# Patient Record
Sex: Male | Born: 1965 | Race: White | Hispanic: No | Marital: Married | Smoking: Never smoker
Health system: Southern US, Community
[De-identification: ages and names within clinical notes are randomized; demographics above are authoritative.]

## PROBLEM LIST (undated history)

## (undated) DIAGNOSIS — M722 Plantar fascial fibromatosis: Secondary | ICD-10-CM

## (undated) DIAGNOSIS — I493 Ventricular premature depolarization: Secondary | ICD-10-CM

## (undated) HISTORY — PX: INGUINAL HERNIA REPAIR: SUR1180

---

## 2002-08-10 ENCOUNTER — Encounter (INDEPENDENT_AMBULATORY_CARE_PROVIDER_SITE_OTHER): Payer: Self-pay

## 2002-08-10 ENCOUNTER — Ambulatory Visit (HOSPITAL_BASED_OUTPATIENT_CLINIC_OR_DEPARTMENT_OTHER): Admission: RE | Admit: 2002-08-10 | Discharge: 2002-08-10 | Payer: Self-pay | Admitting: General Surgery

## 2003-12-11 ENCOUNTER — Encounter: Admission: RE | Admit: 2003-12-11 | Discharge: 2003-12-11 | Payer: Self-pay | Admitting: Family Medicine

## 2004-01-02 ENCOUNTER — Encounter: Admission: RE | Admit: 2004-01-02 | Discharge: 2004-01-02 | Payer: Self-pay | Admitting: Gastroenterology

## 2009-01-17 ENCOUNTER — Emergency Department (HOSPITAL_COMMUNITY): Admission: EM | Admit: 2009-01-17 | Discharge: 2009-01-17 | Payer: Self-pay | Admitting: Family Medicine

## 2010-09-20 NOTE — Op Note (Signed)
NAME:  Lance Christensen, Lance Christensen                        ACCOUNT NO.:  [id number]   MEDICAL RECORD NO.:  [id number]                   PATIENT TYPE:  AMB   LOCATION:  DSC                                  FACILITY:  [facility]   PHYSICIAN:  Ollen Gross. Vernell Morgans, M.D.              DATE OF BIRTH:  1965-12-06   DATE OF PROCEDURE:  08/26/2002  DATE OF DISCHARGE:  08/10/2002                                 OPERATIVE REPORT   PREOPERATIVE DIAGNOSIS:  Left inguinal hernia.   POSTOPERATIVE DIAGNOSIS:  Left indirect inguinal hernia.   PROCEDURE:  Left inguinal hernia repair  with mesh.   SURGEON:  Ollen Gross. Carolynne Edouard, M.D.   ANESTHESIA:  General.   DESCRIPTION OF PROCEDURE:  After informed consent was obtained the patient  was brought to the operating room and placed in the supine position on the  operating table. After adequate induction of general anesthesia the  patient's left groin and abdomen were prepped with Betadine and draped in  the usual sterile manner.   An incision was made from the edge of the pubic tubercle towards the  anterior iliac spine on the left side for a distance of about 5 cm. This  incision was carried down through the skin and subcutaneous tissue sharply  with the electrocautery. A small bridging vein in the subcutaneous tissue  was encountered and clamped, divided and ligated with 3-0 silk ties.   This dissection was then carried down until the fibers of external oblique  were encountered. These fibers were opened along the fibers through the apex  of the external ring using #15 blade knife and Metzenbaum scissors. The  ilioinguinal nerve was identified with the cord and care was taken to avoid  this nerve.   Blunt dissection was carried out at the edge of the pubic tubercle until the  cord structures could be surrounded with 2 fingers. A 1/4-inch Penrose drain  was then placed around the cord structures for retraction purposes. Blunt  skeletonization of the cord was then carried  out and a hernia sac was able  to be separated from the rest of the cord structures without injury to the  vessels of the vas deferens.   The hernia sac was then opened. There were no viscera within the sac. The  sac was then ligated in its space with a 2-0 silk suture ligature and excess  sac was excised and sent to pathology. The stump of the sac was allowed to  retract behind the transversalis muscle.   The floor of the inguinal canal was then reinforced using a 3 x 6 piece  Atrium mesh. Tails were cut in the mesh and wrapped around the cord  structures. The mesh was then sewed inferiorly to the shelving edge of the  inguinal ligament with a running 2-0 Prolene stitch.   The mesh was then tacked superiorly to the muscular aponeurotic strength  layer of  the transversalis with vertical mattress sutures of 2-0 Prolene.  When this was finished the tails were wrapped around the cord structures and  anchored laterally to the shelving edge of the inguinal ligament. The mesh  was in good position without any tension.   The wound was irrigated with saline. The external oblique fascia was then  reapproximated using a running 3-0 Vicryl stitch. The subcutaneous fascia  was then reapproximated with interrupted 3-0 Vicryl stitches. The wound was  then infiltrated with 0.25% Marcaine and the skin was closed with a running  4-0 Monocryl subcuticular stitch. Benzoin, Steri-Strips and sterile  dressings were applied.   The patient tolerated the procedure well. All sponge, instrument and needle  counts were correct at the end of the case. The patient was awakened and  taken to the recovery room in stable condition.                                               Ollen Gross. Vernell Morgans, M.D.    PST/MEDQ  D:  08/26/2002  T:  08/29/2002  Job:  [id number]

## 2014-06-05 ENCOUNTER — Emergency Department (HOSPITAL_COMMUNITY): Payer: 59

## 2014-06-05 ENCOUNTER — Emergency Department (HOSPITAL_COMMUNITY)
Admission: EM | Admit: 2014-06-05 | Discharge: 2014-06-05 | Disposition: A | Discharge status: Home / Self Care | Payer: 59 | Attending: Emergency Medicine | Admitting: Emergency Medicine

## 2014-06-05 DIAGNOSIS — R002 Palpitations: Secondary | ICD-10-CM | POA: Insufficient documentation

## 2014-06-05 DIAGNOSIS — Z880 Allergy status to penicillin: Secondary | ICD-10-CM | POA: Diagnosis not present

## 2014-06-05 LAB — BASIC METABOLIC PANEL
ANION GAP: 8 (ref 5–15)
BUN: 14 mg/dL (ref 6–23)
CALCIUM: 9.4 mg/dL (ref 8.4–10.5)
CHLORIDE: 103 mmol/L (ref 96–112)
CO2: 28 mmol/L (ref 19–32)
Creatinine, Ser: 1.39 mg/dL — ABNORMAL HIGH (ref 0.50–1.35)
GFR, EST AFRICAN AMERICAN: 67 mL/min — AB (ref >90)
GFR, EST NON AFRICAN AMERICAN: 58 mL/min — AB (ref >90)
GLUCOSE: 91 mg/dL (ref 70–99)
POTASSIUM: 3.3 mmol/L — AB (ref 3.5–5.1)
SODIUM: 139 mmol/L (ref 135–145)

## 2014-06-05 LAB — CBC
HCT: 41.3 % (ref 39.0–52.0)
HEMOGLOBIN: 14.6 g/dL (ref 13.0–17.0)
MCH: 30.9 pg (ref 26.0–34.0)
MCHC: 35.4 g/dL (ref 30.0–36.0)
MCV: 87.5 fL (ref 78.0–100.0)
PLATELETS: 312 10*3/uL (ref 150–400)
RBC: 4.72 MIL/uL (ref 4.22–5.81)
RDW: 12.4 % (ref 11.5–15.5)
WBC: 5.4 10*3/uL (ref 4.0–10.5)

## 2014-06-05 LAB — I-STAT TROPONIN, ED: TROPONIN I, POC: 0 ng/mL (ref 0.00–0.08)

## 2014-06-05 NOTE — ED Provider Notes (Signed)
CSN: [id number]     Arrival date & time 06/05/14  1410 History   First MD Initiated Contact with Patient 06/05/14 1603     Chief Complaint  Patient presents with  . Palpitations     (Consider location/radiation/quality/duration/timing/severity/associated sxs/prior Treatment) Patient is a 49 y.o. male presenting with palpitations. The history is provided by the patient and medical records.  Palpitations  This is a 49 y.o. M with no significant PMH presenting to the ED for palpitations intermittently over the past few weeks to a month.  They occur mostly in the morning and at night, lasting a few seconds at a time.  Patient states "feels like his heart is skipping a beat".  States he sometimes feels it with deep breathing.  No chest pain, SOB, dizziness, weakness, nausea, vomiting, diaphoresis.   Patient has no personal or family history of heart disease or heart attack. Family history of hypertension.  Patient is not a smoker, admits to social alcohol use. He drinks 3-4 cups of coffee a day, this has not changed in the past month.  No illicit drug use.  Patient exercises regularly, less so recently due to weather.  No recent travel, surgeries, LE edema, calf pain.  No prior hx of DVT or PE.  No past medical history on file. No past surgical history on file. No family history on file. History  Substance Use Topics  . Smoking status: Not on file  . Smokeless tobacco: Not on file  . Alcohol Use: Not on file    Review of Systems  Cardiovascular: Positive for palpitations.  All other systems reviewed and are negative.     Allergies  Penicillins  Home Medications   Prior to Admission medications   Not on File   BP 159/89 mmHg  Pulse 80  Temp(Src) 98.3 F (36.8 C) (Oral)  Resp 16  Ht 5\' 7"  (1.702 m)  Wt 160 lb (72.576 kg)  BMI 25.05 kg/m2  SpO2 100%   Physical Exam  Constitutional: He is oriented to person, place, and time. He appears well-developed and well-nourished. No  distress.  HENT:  Head: Normocephalic and atraumatic.  Mouth/Throat: Oropharynx is clear and moist.  Eyes: Conjunctivae and EOM are normal. Pupils are equal, round, and reactive to light.  Neck: Normal range of motion. Neck supple.  Cardiovascular: Normal rate, regular rhythm and normal heart sounds.   Pulmonary/Chest: Effort normal and breath sounds normal. No respiratory distress. He has no wheezes.  Abdominal: Soft. Bowel sounds are normal. There is no tenderness. There is no guarding.  Musculoskeletal: Normal range of motion. He exhibits no edema.  No calf asymmetry, tenderness, or palpable cords; no overlying erythema or warmth to touch; DP pulses intact bilaterally  Neurological: He is alert and oriented to person, place, and time.  Skin: Skin is warm and dry. He is not diaphoretic.  Psychiatric: He has a normal mood and affect.  Nursing note and vitals reviewed.   ED Course  Procedures (including critical care time) Labs Review Labs Reviewed  BASIC METABOLIC PANEL - Abnormal; Notable for the following:    Potassium 3.3 (*)    Creatinine, Ser 1.39 (*)    GFR calc non Af Amer 58 (*)    GFR calc Af Amer 67 (*)    All other components within normal limits  CBC  I-STAT TROPOININ, ED    Imaging Review Dg Chest 2 View  06/05/2014   CLINICAL DATA:  Irregular heart rate and palpitations.  EXAM: CHEST -  2 VIEW  COMPARISON:  None  FINDINGS: The heart size and mediastinal contours are within normal limits. There is no evidence of pulmonary edema, consolidation, pneumothorax, nodule or pleural fluid. The visualized skeletal structures are unremarkable.  IMPRESSION: No active disease.   Electronically Signed   By: Irish Lack M.D.   On: 06/05/2014 17:22     EKG Interpretation None      Date: 06/05/2014  Rate: 79  Rhythm: normal sinus rhythm  QRS Axis: normal  Intervals: normal  ST/T Wave abnormalities: normal  Conduction Disutrbances:none  Narrative Interpretation:   Old  EKG Reviewed: none available   MDM   Final diagnoses:  Palpitations   This is a 49 year old male with complaint of intermittent palpitations over the past month. He describes it as "his heart is skipping a beat". No personal or family cardiac history. EKG sinus rhythm without ischemic change.  Lab work obtained in triage which is reassuring, troponin negative.  CXR negative. While talking with patient he was noted to have a few PVCs, he states during this time he had the sensation that his heart was skipping.  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  Given negative cardiac work-up today, feel patient safe for discharge with OP FU.  He was given referral to cardiology for any worsening symptoms, otherwise will FU with PCP.  Discussed plan with patient, he/she acknowledged understanding and agreed with plan of care.  Return precautions given for new or worsening symptoms.  Garlon Hatchet, PA-C 06/05/14 1859  Toy Cookey, MD 06/06/14 [phone]

## 2014-06-05 NOTE — ED Notes (Signed)
Pt reports he's noted his heartrate to feel irregular for the last several weeks. Pt states sensation is not all the time but noticed it more today. Denies CP, dizziness or SOB. Denies medical hx.

## 2014-06-05 NOTE — Discharge Instructions (Signed)
Your work-up today was normal. Follow-up with cardiology if desired, otherwise you may follow-up with your primary care physician. Return to the ED for new or worsening symptoms.

## 2015-06-13 ENCOUNTER — Ambulatory Visit (INDEPENDENT_AMBULATORY_CARE_PROVIDER_SITE_OTHER): Payer: Managed Care, Other (non HMO) | Admitting: Podiatry

## 2015-06-13 ENCOUNTER — Ambulatory Visit (INDEPENDENT_AMBULATORY_CARE_PROVIDER_SITE_OTHER): Payer: Managed Care, Other (non HMO)

## 2015-06-13 VITALS — BP 140/86 | HR 52 | Resp 16

## 2015-06-13 DIAGNOSIS — M722 Plantar fascial fibromatosis: Secondary | ICD-10-CM

## 2015-06-13 MED ORDER — DICLOFENAC SODIUM 75 MG PO TBEC: 75.0000 mg | DELAYED_RELEASE_TABLET | Freq: Two times a day (BID) | ORAL | Status: AC

## 2015-06-13 MED ORDER — TRIAMCINOLONE ACETONIDE 10 MG/ML IJ SUSP
10.0000 mg | Freq: Once | INTRAMUSCULAR | Status: AC
Start: 2015-06-13 — End: 2015-06-13
  Administered 2015-06-13: 10 mg

## 2015-06-13 NOTE — Progress Notes (Signed)
   Subjective:    Patient ID: Lance Christensen, male    DOB: Sep 19, 1965, 50 y.o.   MRN: [id number]  HPI  Pt presents with right heel pain for 7 months now, worsening, he has tried ice, stretching, splinting and nsaid therapies with no relief  Review of Systems  All other systems reviewed and are negative.      Objective:   Physical Exam        Assessment & Plan:

## 2015-06-13 NOTE — Progress Notes (Signed)
Subjective:     Patient ID: Lance Christensen, male   DOB: [birth year], 50 y.o.   MRN: [id number]  HPI patient presents stating that he's had a lot of pain with his right heel for around 7 months and he no longer can run. He has tried to change his activities which helps a little bit and is tried a night splint that he bought over-the-counter ice therapy and over-the-counter orthotics without relief   Review of Systems  All other systems reviewed and are negative.      Objective:   Physical Exam  Constitutional: He is oriented to person, place, and time.  Cardiovascular: Intact distal pulses.   Musculoskeletal: Normal range of motion.  Neurological: He is oriented to person, place, and time.  Skin: Skin is warm.  Nursing note and vitals reviewed.  neurovascular status intact muscle strength adequate range of motion within normal limits with patient noted to have exquisite discomfort plantar aspect right heel at the insertion of the tendon into the calcaneus with a high arch foot type noted. 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 I discussed the acute and chronic nature of this condition due to young age and I've recommended first we focus on the acute and I injected the plantar fascia 3 mg Kenalog 5 mg Xylocaine and applied fascial brace gave instructions on physical therapy and shoe gear modifications. Discussed long-term custom orthotics depending on response and also placed on diclofenac 75 mg twice a day

## 2015-06-13 NOTE — Patient Instructions (Signed)

## 2015-06-21 ENCOUNTER — Encounter: Payer: Self-pay | Admitting: Podiatry

## 2015-06-21 ENCOUNTER — Ambulatory Visit (INDEPENDENT_AMBULATORY_CARE_PROVIDER_SITE_OTHER): Payer: Managed Care, Other (non HMO) | Admitting: Podiatry

## 2015-06-21 VITALS — BP 140/86 | HR 55 | Resp 16

## 2015-06-21 DIAGNOSIS — M722 Plantar fascial fibromatosis: Secondary | ICD-10-CM | POA: Diagnosis not present

## 2015-06-21 NOTE — Progress Notes (Signed)
Subjective:     Patient ID: Lance Christensen, male   DOB: 1965-06-09, 50 y.o.   MRN: [id number]  HPI patient states that it is improved some but still sore and achy and I have not done the types of activity that I want to do the future   Review of Systems     Objective:   Physical Exam Neurovascular status intact with continued discomfort but moderated plantar aspect right heel    Assessment:     Plantar fasciitis right improving but still present with moderate depression of the arch    Plan:     Recommended long-term orthotics with scanning accomplished today and discussed continued physical therapy shoe gear modifications and gradual increase in activity levels

## 2015-07-18 ENCOUNTER — Ambulatory Visit: Payer: Managed Care, Other (non HMO) | Admitting: *Deleted

## 2015-07-18 DIAGNOSIS — M722 Plantar fascial fibromatosis: Secondary | ICD-10-CM

## 2015-07-18 NOTE — Patient Instructions (Signed)

## 2015-07-18 NOTE — Progress Notes (Signed)
Patient ID: Lance Christensen, male   DOB: May 19, 1965, 50 y.o.   MRN: [id number] Patient presents for orthotic pick up.  Verbal and written break in and wear instructions given.  Patient will follow up in 4 weeks if symptoms worsen or fail to improve.

## 2015-08-24 ENCOUNTER — Observation Stay (HOSPITAL_COMMUNITY)
Admission: EM | Admit: 2015-08-24 | Discharge: 2015-08-25 | Disposition: A | Discharge status: Home / Self Care | Payer: Managed Care, Other (non HMO) | Attending: General Surgery | Admitting: General Surgery

## 2015-08-24 ENCOUNTER — Observation Stay (HOSPITAL_COMMUNITY): Payer: Managed Care, Other (non HMO) | Admitting: Certified Registered Nurse Anesthetist

## 2015-08-24 ENCOUNTER — Emergency Department (HOSPITAL_COMMUNITY): Payer: Managed Care, Other (non HMO)

## 2015-08-24 ENCOUNTER — Encounter (HOSPITAL_COMMUNITY)
Admission: EM | Disposition: A | Discharge status: Home / Self Care | Payer: Self-pay | Source: Home / Self Care | Attending: Emergency Medicine

## 2015-08-24 ENCOUNTER — Encounter (HOSPITAL_COMMUNITY): Payer: Self-pay

## 2015-08-24 DIAGNOSIS — N179 Acute kidney failure, unspecified: Secondary | ICD-10-CM | POA: Diagnosis not present

## 2015-08-24 DIAGNOSIS — D72829 Elevated white blood cell count, unspecified: Secondary | ICD-10-CM | POA: Insufficient documentation

## 2015-08-24 DIAGNOSIS — K565 Intestinal adhesions [bands] with obstruction (postprocedural) (postinfection): Principal | ICD-10-CM | POA: Insufficient documentation

## 2015-08-24 DIAGNOSIS — K50019 Crohn's disease of small intestine with unspecified complications: Secondary | ICD-10-CM

## 2015-08-24 DIAGNOSIS — Z0189 Encounter for other specified special examinations: Secondary | ICD-10-CM

## 2015-08-24 DIAGNOSIS — R1031 Right lower quadrant pain: Secondary | ICD-10-CM | POA: Diagnosis present

## 2015-08-24 DIAGNOSIS — K56609 Unspecified intestinal obstruction, unspecified as to partial versus complete obstruction: Secondary | ICD-10-CM

## 2015-08-24 HISTORY — DX: Plantar fascial fibromatosis: M72.2

## 2015-08-24 HISTORY — DX: Ventricular premature depolarization: I49.3

## 2015-08-24 HISTORY — PX: LAPAROSCOPIC ABDOMINAL EXPLORATION: SHX6249

## 2015-08-24 LAB — COMPREHENSIVE METABOLIC PANEL
ALBUMIN: 4.4 g/dL (ref 3.5–5.0)
ALT: 28 U/L (ref 17–63)
ANION GAP: 13 (ref 5–15)
AST: 25 U/L (ref 15–41)
Alkaline Phosphatase: 62 U/L (ref 38–126)
BILIRUBIN TOTAL: 0.6 mg/dL (ref 0.3–1.2)
BUN: 17 mg/dL (ref 6–20)
CO2: 26 mmol/L (ref 22–32)
Calcium: 9.6 mg/dL (ref 8.9–10.3)
Chloride: 101 mmol/L (ref 101–111)
Creatinine, Ser: 1.32 mg/dL — ABNORMAL HIGH (ref 0.61–1.24)
GFR calc Af Amer: >60 mL/min (ref >60)
Glucose, Bld: 133 mg/dL — ABNORMAL HIGH (ref 65–99)
POTASSIUM: 4 mmol/L (ref 3.5–5.1)
Sodium: 140 mmol/L (ref 135–145)
TOTAL PROTEIN: 7.6 g/dL (ref 6.5–8.1)

## 2015-08-24 LAB — URINALYSIS, ROUTINE W REFLEX MICROSCOPIC
Bilirubin Urine: NEGATIVE
Glucose, UA: NEGATIVE mg/dL
Hgb urine dipstick: NEGATIVE
Ketones, ur: NEGATIVE mg/dL
LEUKOCYTES UA: NEGATIVE
NITRITE: NEGATIVE
PH: 7 (ref 5.0–8.0)
Protein, ur: NEGATIVE mg/dL
SPECIFIC GRAVITY, URINE: 1.028 (ref 1.005–1.030)

## 2015-08-24 LAB — CBC
HEMATOCRIT: 42.5 % (ref 39.0–52.0)
HEMOGLOBIN: 14.4 g/dL (ref 13.0–17.0)
MCH: 30.4 pg (ref 26.0–34.0)
MCHC: 33.9 g/dL (ref 30.0–36.0)
MCV: 89.7 fL (ref 78.0–100.0)
Platelets: 338 10*3/uL (ref 150–400)
RBC: 4.74 MIL/uL (ref 4.22–5.81)
RDW: 13.1 % (ref 11.5–15.5)
WBC: 13 10*3/uL — AB (ref 4.0–10.5)

## 2015-08-24 LAB — LIPASE, BLOOD: Lipase: 28 U/L (ref 11–51)

## 2015-08-24 SURGERY — EXPLORATION, ABDOMEN, LAPAROSCOPIC
Anesthesia: General

## 2015-08-24 MED ORDER — SUCCINYLCHOLINE CHLORIDE 20 MG/ML IJ SOLN
INTRAMUSCULAR | Status: DC | PRN
  Administered 2015-08-24: 100 mg via INTRAVENOUS

## 2015-08-24 MED ORDER — FENTANYL CITRATE (PF) 100 MCG/2ML IJ SOLN
50.0000 ug | INTRAMUSCULAR | Status: DC | PRN
  Administered 2015-08-24: 50 ug via NASAL

## 2015-08-24 MED ORDER — LACTATED RINGERS IV SOLN
INTRAVENOUS | Status: DC
  Administered 2015-08-24 (×2): via INTRAVENOUS

## 2015-08-24 MED ORDER — ONDANSETRON HCL 4 MG/2ML IJ SOLN
INTRAMUSCULAR | Status: DC | PRN
  Administered 2015-08-24: 4 mg via INTRAVENOUS

## 2015-08-24 MED ORDER — DIPHENHYDRAMINE HCL 50 MG/ML IJ SOLN: 25.0000 mg | Freq: Four times a day (QID) | INTRAMUSCULAR | Status: DC | PRN

## 2015-08-24 MED ORDER — MIDAZOLAM HCL 5 MG/5ML IJ SOLN
INTRAMUSCULAR | Status: DC | PRN
  Administered 2015-08-24: 2 mg via INTRAVENOUS

## 2015-08-24 MED ORDER — MORPHINE SULFATE (PF) 2 MG/ML IV SOLN
1.0000 mg | INTRAVENOUS | Status: DC | PRN
  Administered 2015-08-24: 2 mg via INTRAVENOUS
  Filled 2015-08-24: qty 1

## 2015-08-24 MED ORDER — BUPIVACAINE-EPINEPHRINE (PF) 0.25% -1:200000 IJ SOLN
INTRAMUSCULAR | Status: AC
  Filled 2015-08-24: qty 30

## 2015-08-24 MED ORDER — ONDANSETRON 4 MG PO TBDP
4.0000 mg | ORAL_TABLET | Freq: Once | ORAL | Status: AC
  Administered 2015-08-24: 4 mg via ORAL

## 2015-08-24 MED ORDER — DEXAMETHASONE SODIUM PHOSPHATE 10 MG/ML IJ SOLN
INTRAMUSCULAR | Status: DC | PRN
Start: 2015-08-24 — End: 2015-08-24
  Administered 2015-08-24: 10 mg via INTRAVENOUS

## 2015-08-24 MED ORDER — POTASSIUM CHLORIDE IN NACL 20-0.9 MEQ/L-% IV SOLN
INTRAVENOUS | Status: DC
  Administered 2015-08-24 (×2): via INTRAVENOUS
  Filled 2015-08-24 (×3): qty 1000

## 2015-08-24 MED ORDER — SODIUM CHLORIDE 0.9 % IV BOLUS (SEPSIS)
1000.0000 mL | Freq: Once | INTRAVENOUS | Status: AC
  Administered 2015-08-24: 1000 mL via INTRAVENOUS

## 2015-08-24 MED ORDER — ROCURONIUM BROMIDE 100 MG/10ML IV SOLN
INTRAVENOUS | Status: DC | PRN
  Administered 2015-08-24: 45 mg via INTRAVENOUS
  Administered 2015-08-24: 5 mg via INTRAVENOUS
  Administered 2015-08-24: 10 mg via INTRAVENOUS

## 2015-08-24 MED ORDER — METRONIDAZOLE IN NACL 5-0.79 MG/ML-% IV SOLN
500.0000 mg | Freq: Three times a day (TID) | INTRAVENOUS | Status: DC
  Administered 2015-08-24 – 2015-08-25 (×2): 500 mg via INTRAVENOUS
  Filled 2015-08-24 (×5): qty 100

## 2015-08-24 MED ORDER — METRONIDAZOLE IN NACL 5-0.79 MG/ML-% IV SOLN
500.0000 mg | INTRAVENOUS | Status: DC
Start: 2015-08-24 — End: 2015-08-25
  Filled 2015-08-24: qty 100

## 2015-08-24 MED ORDER — GLYCOPYRROLATE 0.2 MG/ML IJ SOLN
INTRAMUSCULAR | Status: DC | PRN
  Administered 2015-08-24: 0.2 mg via INTRAVENOUS

## 2015-08-24 MED ORDER — HYDRALAZINE HCL 20 MG/ML IJ SOLN: 10.0000 mg | INTRAMUSCULAR | Status: DC | PRN

## 2015-08-24 MED ORDER — SUGAMMADEX SODIUM 200 MG/2ML IV SOLN
INTRAVENOUS | Status: DC | PRN
  Administered 2015-08-24: 200 mg via INTRAVENOUS

## 2015-08-24 MED ORDER — METHOCARBAMOL 500 MG PO TABS: 500.0000 mg | ORAL_TABLET | Freq: Four times a day (QID) | ORAL | Status: DC | PRN

## 2015-08-24 MED ORDER — HYDROCODONE-ACETAMINOPHEN 5-325 MG PO TABS: 1.0000 | ORAL_TABLET | ORAL | Status: DC | PRN

## 2015-08-24 MED ORDER — MORPHINE SULFATE (PF) 4 MG/ML IV SOLN
4.0000 mg | Freq: Once | INTRAVENOUS | Status: AC
  Administered 2015-08-24: 4 mg via INTRAVENOUS
  Filled 2015-08-24: qty 1

## 2015-08-24 MED ORDER — FENTANYL CITRATE (PF) 250 MCG/5ML IJ SOLN
INTRAMUSCULAR | Status: AC
  Filled 2015-08-24: qty 5

## 2015-08-24 MED ORDER — FENTANYL CITRATE (PF) 100 MCG/2ML IJ SOLN
INTRAMUSCULAR | Status: AC
  Filled 2015-08-24: qty 2

## 2015-08-24 MED ORDER — DIATRIZOATE MEGLUMINE & SODIUM 66-10 % PO SOLN
90.0000 mL | Freq: Once | ORAL | Status: DC
  Filled 2015-08-24: qty 90

## 2015-08-24 MED ORDER — ONDANSETRON HCL 4 MG/2ML IJ SOLN
4.0000 mg | Freq: Four times a day (QID) | INTRAMUSCULAR | Status: DC | PRN
  Administered 2015-08-24: 4 mg via INTRAVENOUS
  Filled 2015-08-24: qty 2

## 2015-08-24 MED ORDER — 0.9 % SODIUM CHLORIDE (POUR BTL) OPTIME
TOPICAL | Status: DC | PRN
  Administered 2015-08-24: 1000 mL

## 2015-08-24 MED ORDER — FENTANYL CITRATE (PF) 100 MCG/2ML IJ SOLN
INTRAMUSCULAR | Status: DC | PRN
  Administered 2015-08-24: 100 ug via INTRAVENOUS
  Administered 2015-08-24 (×3): 50 ug via INTRAVENOUS

## 2015-08-24 MED ORDER — ENOXAPARIN SODIUM 40 MG/0.4ML ~~LOC~~ SOLN: 40.0000 mg | SUBCUTANEOUS | Status: DC

## 2015-08-24 MED ORDER — MIDAZOLAM HCL 2 MG/2ML IJ SOLN
INTRAMUSCULAR | Status: AC
  Filled 2015-08-24: qty 2

## 2015-08-24 MED ORDER — DEXTROSE 5 % IV SOLN
2.0000 g | INTRAVENOUS | Status: DC
  Administered 2015-08-24: 2 g via INTRAVENOUS
  Filled 2015-08-24 (×2): qty 2

## 2015-08-24 MED ORDER — SUGAMMADEX SODIUM 500 MG/5ML IV SOLN
INTRAVENOUS | Status: AC
  Filled 2015-08-24: qty 5

## 2015-08-24 MED ORDER — IOPAMIDOL (ISOVUE-300) INJECTION 61%
INTRAVENOUS | Status: AC
  Administered 2015-08-24: 100 mL
  Filled 2015-08-24: qty 100

## 2015-08-24 MED ORDER — ONDANSETRON HCL 4 MG/2ML IJ SOLN
4.0000 mg | Freq: Once | INTRAMUSCULAR | Status: AC
  Administered 2015-08-24: 4 mg via INTRAVENOUS
  Filled 2015-08-24: qty 2

## 2015-08-24 MED ORDER — BUPIVACAINE-EPINEPHRINE 0.25% -1:200000 IJ SOLN
INTRAMUSCULAR | Status: DC | PRN
  Administered 2015-08-24: 10 mL

## 2015-08-24 MED ORDER — DIPHENHYDRAMINE HCL 25 MG PO CAPS: 25.0000 mg | ORAL_CAPSULE | Freq: Four times a day (QID) | ORAL | Status: DC | PRN

## 2015-08-24 MED ORDER — METOCLOPRAMIDE HCL 5 MG/ML IJ SOLN: 10.0000 mg | Freq: Once | INTRAMUSCULAR | Status: DC | PRN

## 2015-08-24 MED ORDER — HYDROMORPHONE HCL 1 MG/ML IJ SOLN: 0.2500 mg | INTRAMUSCULAR | Status: DC | PRN

## 2015-08-24 MED ORDER — ACETAMINOPHEN 325 MG PO TABS: 650.0000 mg | ORAL_TABLET | Freq: Four times a day (QID) | ORAL | Status: DC | PRN

## 2015-08-24 MED ORDER — PROPOFOL 10 MG/ML IV BOLUS
INTRAVENOUS | Status: DC | PRN
  Administered 2015-08-24: 160 mg via INTRAVENOUS

## 2015-08-24 MED ORDER — MEPERIDINE HCL 25 MG/ML IJ SOLN: 6.2500 mg | INTRAMUSCULAR | Status: DC | PRN

## 2015-08-24 MED ORDER — ACETAMINOPHEN 650 MG RE SUPP: 650.0000 mg | Freq: Four times a day (QID) | RECTAL | Status: DC | PRN

## 2015-08-24 MED ORDER — ONDANSETRON 4 MG PO TBDP
ORAL_TABLET | ORAL | Status: AC
  Filled 2015-08-24: qty 1

## 2015-08-24 MED ORDER — SODIUM CHLORIDE 0.9 % IR SOLN
Status: DC | PRN
  Administered 2015-08-24: 1000 mL

## 2015-08-24 SURGICAL SUPPLY — 36 items
CANISTER SUCTION 2500CC (MISCELLANEOUS) ×3 IMPLANT
CHLORAPREP W/TINT 26ML (MISCELLANEOUS) ×3 IMPLANT
COVER SURGICAL LIGHT HANDLE (MISCELLANEOUS) ×3 IMPLANT
DECANTER SPIKE VIAL GLASS SM (MISCELLANEOUS) ×3 IMPLANT
DERMABOND ADHESIVE PROPEN (GAUZE/BANDAGES/DRESSINGS) ×2
DERMABOND ADVANCED .7 DNX6 (GAUZE/BANDAGES/DRESSINGS) ×1 IMPLANT
ELECT REM PT RETURN 9FT ADLT (ELECTROSURGICAL) ×3
ELECTRODE REM PT RTRN 9FT ADLT (ELECTROSURGICAL) ×1 IMPLANT
ENDOLOOP SUT PDS II  0 18 (SUTURE) ×4
ENDOLOOP SUT PDS II 0 18 (SUTURE) ×2 IMPLANT
GLOVE BIOGEL PI IND STRL 7.5 (GLOVE) ×2 IMPLANT
GLOVE BIOGEL PI IND STRL 8 (GLOVE) ×1 IMPLANT
GLOVE BIOGEL PI INDICATOR 7.5 (GLOVE) ×4
GLOVE BIOGEL PI INDICATOR 8 (GLOVE) ×2
GLOVE SURG SS PI 7.0 STRL IVOR (GLOVE) ×6 IMPLANT
GOWN STRL REUS W/ TWL LRG LVL3 (GOWN DISPOSABLE) ×3 IMPLANT
GOWN STRL REUS W/TWL LRG LVL3 (GOWN DISPOSABLE) ×6
KIT BASIN OR (CUSTOM PROCEDURE TRAY) ×3 IMPLANT
KIT ROOM TURNOVER OR (KITS) ×3 IMPLANT
LIGASURE IMPACT 36 18CM CVD LR (INSTRUMENTS) ×3 IMPLANT
NS IRRIG 1000ML POUR BTL (IV SOLUTION) ×3 IMPLANT
PAD ARMBOARD 7.5X6 YLW CONV (MISCELLANEOUS) ×6 IMPLANT
SCALPEL HARMONIC ACE (MISCELLANEOUS) ×3 IMPLANT
SCISSORS LAP 5X35 DISP (ENDOMECHANICALS) ×3 IMPLANT
SET IRRIG TUBING LAPAROSCOPIC (IRRIGATION / IRRIGATOR) ×3 IMPLANT
SLEEVE ENDOPATH XCEL 5M (ENDOMECHANICALS) ×6 IMPLANT
STAPLER VISISTAT 35W (STAPLE) ×3 IMPLANT
SUT SILK 2 0 TIES 10X30 (SUTURE) ×3 IMPLANT
SUT SILK 3 0 TIES 10X30 (SUTURE) ×3 IMPLANT
TOWEL OR 17X26 10 PK STRL BLUE (TOWEL DISPOSABLE) ×3 IMPLANT
TRAY FOLEY CATH 16FRSI W/METER (SET/KITS/TRAYS/PACK) ×3 IMPLANT
TRAY LAPAROSCOPIC MC (CUSTOM PROCEDURE TRAY) ×3 IMPLANT
TROCAR XCEL NON-BLD 5MMX100MML (ENDOMECHANICALS) ×3 IMPLANT
TUBE CONNECTING 12'X1/4 (SUCTIONS) ×1
TUBE CONNECTING 12X1/4 (SUCTIONS) ×2 IMPLANT
TUBING FILTER THERMOFLATOR (ELECTROSURGICAL) ×3 IMPLANT

## 2015-08-24 NOTE — ED Notes (Signed)
Pt here with c/o generalized abdominal pain but states pain is worse on the right side. Pain started tonight around 8pm after dinner. Pt also reports nausea, dry heaving, denies diarrhea.

## 2015-08-24 NOTE — H&P (Signed)
[name] Admission Note  Lance Christensen [facility] 1965/05/21  [id number].    Requesting MD: Dr. Dina Rich Chief Complaint/Reason for Consult: SBO  HPI:  50 y/o previously healthy white male with only PMH PVC's, plantar fasciitis presents to [facility] with acute onset of RLQ abdominal pain since 7pm last night.  He had a normal dinner last night of spaghetti and wine.  By 10-10:30pm the pain became severe.  He tried to go to sleep and drinking water, but that didn't help.  No precipitating factors.  About midnight the nausea and dry heaving started.  He didn't throw up until he arrived in the ER which consisted of non-bilious/ non-bloody food contents.  He feels distended, but no fever/chills, diarrhea, CP/SOB or dysuria.  He notes his last BM was last night, no melena or hematochezia.  He notes 10 years ago he had a couple of more minor episodes of abdominal pain similar to this, but not anywhere as severe.  He saw his PCP and was prescribed GERD meds, but he never took them and his pain eventually subsided completely.  He never had any imaging workup.  He notes he had a CSP last year at [facility] which was normal except for polyps and return in 5 years for repeat CSP.  No personal hx or family hx of crohn's, UC, or colon cancers.  No concern for food poisoning, no sick contacts, no recent travel.  H/o b/l inguinal hernia repairs with mesh in 1990 and 2000.  In the ER the EDP thought he had appendicitis, but the appendix was not visualized on the CT.  He had a transition point at the terminal ileum and dilated small bowel with fecalization.  WBC was 13.0, Cr. 1.32.     ROS: All systems reviewed and otherwise negative except for as above  No family history on file.  Past Medical History  Diagnosis Date  . PVC's (premature ventricular contractions)   . Plantar fasciitis     Past Surgical History  Procedure Laterality Date  . Inguinal hernia repair Bilateral 1990, 2000    Most recent done in  [place] History:  reports that he has never smoked. He does not have any smokeless tobacco history on file. He reports that he drinks alcohol. His drug history is not on file.  Allergies:  Allergies  Allergen Reactions  . Penicillins Rash     (Not in a hospital admission)  Blood pressure 124/77, pulse 64, temperature 98.5 F (36.9 C), temperature source Oral, resp. rate 14, height _0  (1.676 m), weight 161 lb 6 oz (73.199 kg), SpO2 98 %. Physical Exam: General: pleasant, WD/WN white male who is laying in bed in NAD HEENT: head is normocephalic, atraumatic.  Sclera are noninjected.  PERRL.  Ears and nose without any masses or lesions.  Mouth is pink and moist Heart: regular, rate, and rhythm.  No obvious murmurs, gallops, or rubs noted.  Palpable pedal pulses bilaterally Lungs: CTAB, no wheezes, rhonchi, or rales noted.  Respiratory effort nonlabored Abd: soft, distended, quite tender in the RLQ, few BS, no masses, hernias, or organomegaly, scars to b/l groins, groins soft and non-tender MS: all 4 extremities are symmetrical with no cyanosis, clubbing, or edema. Skin: warm and dry with no masses, lesions, or rashes Psych: A&Ox3 with an appropriate affect.   Results for orders placed or performed during the hospital encounter of 08/24/15 (from the past 48 hour(s))  Lipase, blood     Status: None  Collection Time: 08/24/15  2:32 AM  Result Value Ref Range   Lipase 28 11 - 51 U/L  Comprehensive metabolic panel     Status: Abnormal   Collection Time: 08/24/15  2:32 AM  Result Value Ref Range   Sodium 140 135 - 145 mmol/L   Potassium 4.0 3.5 - 5.1 mmol/L   Chloride 101 101 - 111 mmol/L   CO2 26 22 - 32 mmol/L   Glucose, Bld 133 (H) 65 - 99 mg/dL   BUN 17 6 - 20 mg/dL   Creatinine, Ser 1.32 (H) 0.61 - 1.24 mg/dL   Calcium 9.6 8.9 - 10.3 mg/dL   Total Protein 7.6 6.5 - 8.1 g/dL   Albumin 4.4 3.5 - 5.0 g/dL   AST 25 15 - 41 U/L   ALT 28 17 - 63 U/L   Alkaline  Phosphatase 62 38 - 126 U/L   Total Bilirubin 0.6 0.3 - 1.2 mg/dL   GFR calc non Af Amer >60 >60 mL/min   GFR calc Af Amer >60 >60 mL/min    Comment: (NOTE) The eGFR has been calculated using the CKD EPI equation. This calculation has not been validated in all clinical situations. eGFR's persistently <60 mL/min signify possible Chronic Kidney Disease.    Anion gap 13 5 - 15  CBC     Status: Abnormal   Collection Time: 08/24/15  2:32 AM  Result Value Ref Range   WBC 13.0 (H) 4.0 - 10.5 K/uL   RBC 4.74 4.22 - 5.81 MIL/uL   Hemoglobin 14.4 13.0 - 17.0 g/dL   HCT 42.5 39.0 - 52.0 %   MCV 89.7 78.0 - 100.0 fL   MCH 30.4 26.0 - 34.0 pg   MCHC 33.9 30.0 - 36.0 g/dL   RDW 13.1 11.5 - 15.5 %   Platelets 338 150 - 400 K/uL  Urinalysis, Routine w reflex microscopic (not at [facility])     Status: None   Collection Time: 08/24/15  5:59 AM  Result Value Ref Range   Color, Urine YELLOW YELLOW   APPearance CLEAR CLEAR   Specific Gravity, Urine 1.028 1.005 - 1.030   pH 7.0 5.0 - 8.0   Glucose, UA NEGATIVE NEGATIVE mg/dL   Hgb urine dipstick NEGATIVE NEGATIVE   Bilirubin Urine NEGATIVE NEGATIVE   Ketones, ur NEGATIVE NEGATIVE mg/dL   Protein, ur NEGATIVE NEGATIVE mg/dL   Nitrite NEGATIVE NEGATIVE   Leukocytes, UA NEGATIVE NEGATIVE    Comment: MICROSCOPIC NOT DONE ON URINES WITH NEGATIVE PROTEIN, BLOOD, LEUKOCYTES, NITRITE, OR GLUCOSE <1000 mg/dL.   Ct Abdomen Pelvis W Contrast  08/24/2015  CLINICAL DATA:  RIGHT lower quadrant pain and vomiting beginning at 10 p.m. last night. History of hernia repairs. EXAM: CT ABDOMEN AND PELVIS WITH CONTRAST TECHNIQUE: Multidetector CT imaging of the abdomen and pelvis was performed using the standard protocol following bolus administration of intravenous contrast. CONTRAST:  123m ISOVUE-300 IOPAMIDOL (ISOVUE-300) INJECTION 61% COMPARISON:  CT abdomen and pelvis December 11, 2003 FINDINGS: LUNG BASES: Mild dependent atelectasis. Heart size is normal. No pericardial  effusions. SOLID ORGANS: The liver, spleen, gallbladder, pancreas and adrenal glands are unremarkable. Slightly swirled appearance of the mesentery. GASTROINTESTINAL TRACT: Multiple loops of stool distended distal small bowel measuring up to 3 cm without wall thickening. Associated mesenteric fat stranding. Transition point at the level of the terminal ileum. Moderate amount of ascending colonic stool. Nonvisualized appendix. KIDNEYS/ URINARY TRACT: Kidneys are orthotopic, demonstrating symmetric enhancement. No nephrolithiasis, hydronephrosis or solid renal masses. 10 mm  LEFT upper pole cyst. The unopacified ureters are normal in course and caliber. Delayed imaging through the kidneys demonstrates symmetric prompt contrast excretion within the proximal urinary collecting system. Urinary bladder is partially distended and unremarkable. PERITONEUM/RETROPERITONEUM: Aortoiliac vessels are normal in course and caliber. No lymphadenopathy by CT size criteria. Prostate size appears mildly enlarged. Small amount of low-density free fluid in the pelvis is likely physiologic. SOFT TISSUE/OSSEOUS STRUCTURES: Non-suspicious. Scarring in RIGHT inguinal canal compatible with prior herniorrhaphy. IMPRESSION: Distal small bowel feces and terminal ileal transition point. Mild inflammation of the terminal ileum may be inflammatory. Findings may also represent low-grade obstruction from adhesions, less likely internal hernia. No perforation. Nonvisualized appendix. Status post apparent RIGHT inguinal herniorrhaphy without mesh. Electronically Signed   By: Elon Alas M.D.   On: 08/24/2015 06:39      Assessment/Plan SBO with transition point at the terminal ileum Leukocytosis AKI - Cr 1.32  Plan: 1.  Admit to [facility] for observation, pain control, and further investigation of this SBO.  Could be due to infectious, inflammatory, appendicitis.    2.  NG tube, SB Protocol, NPO, IVF, pain control, antiemetics, antibiotics  (rocephin/flagyl Day #1) 3.  SCD's and lovenox for DVT proph 4.  Ambulate and IS 5.  Serial exam, labs, and imaging.  If not improving or he acutely worsens may need exploratory surgery.  \   Nat Christen, [facility] Surgery 08/24/2015, 8:31 AM Pager: [phone] (7am - 4:30pm M-F; 7am - 11:30am Sa/Su)

## 2015-08-24 NOTE — ED Notes (Signed)
[facility]

## 2015-08-24 NOTE — Op Note (Signed)
Preoperative diagnosis: small bowel obstruction  Postoperative diagnosis: same   Procedure: laparoscopic lysis of adhesions, laparoscopic appendectomy  Surgeon: Feliciana RossettiLuke Timber Lucarelli, M.D.  Asst: none  Anesthesia: General  Indications for procedure: Lance Christensen is a 50 y.o. year old male with symptoms of right lower quadrant abdominal pain, nausea and vomiting. His CT scan showed dilated loops of small bowel consistent with small bowel obstruction. Due to his absence of previous abdominal surgery, we decided to explore to determine the cause of obstruction.  Description of procedure: The patient was brought into the operative suite. Anesthesia was administered with General endotracheal anesthesia. WHO checklist was applied. The patient was then placed in supine. The area was prepped and draped in the usual sterile fashion.  Next quarter percent Marcaine was injected into the left subcostal space. Transverse incision was made and a 5 mm optical viewing trocar was used To the peritoneal cavity. Pneumoperitoneum was applied with a high flow low pressure. Initial inspection there was a large adhesive band in the right lower quadrant of from the bowel up to the abdominal wall. 2 additional trochars are placed in the left side both with quarter percent Marcaine injected into the area prior to incision. After inspecting this adhesive band appeared to come from the terminal ileum up to the abdominal wall and did allow for things to herniate in a internal hernia-like fashion on the lateral aspect of this band. Band was taken down with Harmonic scalpel. The dissection was used to free small bowel and mesentery from the base band. The entirety of the small bowel was run there are no other adhesive bands seen no other causes of obstruction. Turned our attention back to the cecum identifying the appendix which did not appear inflamed was located on the posterior and lateral aspect of cecum. Presentation we decided  to go ahead and remove this therefore my scalpel was used to ligate the mesoappendix. Next 2-0 PDS Endoloops were used to close the appendix base. Next was divided in between these 2 Endoloops and removed a 5 mm trocar. Irrigated the abdomen and pelvis which showed no purulence or other abnormalities. Hemostasis was seen to be intact. Pneumoperitoneum was then evacuated all trochars were removed. All incisions were closed with a 4-0 Monocryl subcuticular interrupted suture. Dermabond was put in place for dressing. Patient awoke from anesthesia and brought back in simple condition. All counts are correct.   Findings: adhesive band of the terminal ileum to the abdominal wall  Specimen: appendix  Implant: none  Blood loss: <2350ml  Local anesthesia: 10 ml 0.25% marcaine with epinephrine  Complications: none  Feliciana RossettiLuke Eloisa Chokshi, M.D. General, Bariatric, & Minimally Invasive Surgery [facility]

## 2015-08-24 NOTE — Transfer of Care (Signed)
Immediate Anesthesia Transfer of Care Note  Patient: Lance Christensen  Procedure(s) Performed: Procedure(s): LAPAROSCOPIC ABDOMINAL EXPLORATION lysis of adhesions appendectomy (N/A)  Patient Location: PACU  Anesthesia Type:General  Level of Consciousness: awake, alert , oriented and patient cooperative  Airway & Oxygen Therapy: Patient Spontanous Breathing and Patient connected to nasal cannula oxygen  Post-op Assessment: Report given to RN  Post vital signs: Reviewed and stable  Last Vitals:  Filed Vitals:   08/24/15 1456 08/24/15 1801  BP: 137/86 123/79  Pulse: 65   Temp: 37 C 36.4 C  Resp: 15 15    Complications: No apparent anesthesia complications

## 2015-08-24 NOTE — ED Provider Notes (Signed)
CSN: [id number]     Arrival date & time 08/24/15  0206 History  By signing my name below, I, Arianna Nassar, attest that this documentation has been prepared under the direction and in the presence of Shon Baton, MD. Electronically Signed: Octavia Heir, ED Scribe. 08/24/2015. 4:05 AM.     Chief Complaint  Patient presents with  . Abdominal Pain     The history is provided by the patient. No language interpreter was used.   HPI Comments: Lance Christensen is a 50 y.o. male who presents to the Emergency Department complaining of constant, gradual worsening, moderate, RLQ abdominal pain with associated nausea and vomiting onset this evening. Pt notes his abdominal pain started shortly after dinner and progressively gotten worse. Pt still has his gall bladder and appendix. Pain is non-radiating. Pt has had decreased urination since abdominal pain started. Pt reports holding his knees up in a crouched position minimally alleviates his pain. Denies any other urinary symptoms.  History reviewed. No pertinent past medical history. Past Surgical History  Procedure Laterality Date  . Hernia repair     No family history on file. Social History  Substance Use Topics  . Smoking status: Never Smoker   . Smokeless tobacco: None  . Alcohol Use: 0.0 oz/week    0 Standard drinks or equivalent per week    Review of Systems  Constitutional: Negative for fever.  Gastrointestinal: Positive for nausea, vomiting and abdominal pain.  Genitourinary: Positive for difficulty urinating. Negative for dysuria and hematuria.  All other systems reviewed and are negative.     Allergies  Penicillins  Home Medications   Prior to Admission medications   Medication Sig Start Date End Date Taking? Authorizing Provider  diclofenac (VOLTAREN) 75 MG EC tablet Take 1 tablet (75 mg total) by mouth 2 (two) times daily. Patient not taking: Reported on 08/24/2015 06/13/15   Lenn Sink, DPM   Triage vitals:  BP 138/77 mmHg  Pulse 57  Temp(Src) 98.5 F (36.9 C) (Oral)  Resp 18  Ht  (1.676 m)  Wt 161 lb 6 oz (73.199 kg)  BMI 26.06 kg/m2  SpO2 100% Physical Exam  Constitutional: He is oriented to person, place, and time. No distress.  Ill-appearing but nontoxic  HENT:  Head: Normocephalic and atraumatic.  Cardiovascular: Normal rate, regular rhythm and normal heart sounds.   No murmur heard. Pulmonary/Chest: Effort normal and breath sounds normal. No respiratory distress. He has no wheezes.  Abdominal: Soft. Bowel sounds are normal. There is tenderness. There is guarding. There is no rebound.  Tenderness to palpation periumbilical and right lower quadrant, voluntary guarding  Musculoskeletal: He exhibits no edema.  Neurological: He is alert and oriented to person, place, and time.  Skin: Skin is warm and dry.  Psychiatric: He has a normal mood and affect.  Nursing note and vitals reviewed.   ED Course  Procedures  DIAGNOSTIC STUDIES: Oxygen Saturation is 100% on RA, normal by my interpretation.  COORDINATION OF CARE:  4:04 AM Will order CT of abdomen. Discussed treatment plan with pt at bedside and pt agreed to plan.  Labs Review Labs Reviewed  COMPREHENSIVE METABOLIC PANEL - Abnormal; Notable for the following:    Glucose, Bld 133 (*)    Creatinine, Ser 1.32 (*)    All other components within normal limits  CBC - Abnormal; Notable for the following:    WBC 13.0 (*)    All other components within normal limits  LIPASE, BLOOD  URINALYSIS, ROUTINE W REFLEX MICROSCOPIC (NOT AT [facility])    Imaging Review Ct Abdomen Pelvis W Contrast  08/24/2015  CLINICAL DATA:  RIGHT lower quadrant pain and vomiting beginning at 10 p.m. last night. History of hernia repairs. EXAM: CT ABDOMEN AND PELVIS WITH CONTRAST TECHNIQUE: Multidetector CT imaging of the abdomen and pelvis was performed using the standard protocol following bolus administration of intravenous contrast. CONTRAST:  100mL  ISOVUE-300 IOPAMIDOL (ISOVUE-300) INJECTION 61% COMPARISON:  CT abdomen and pelvis December 11, 2003 FINDINGS: LUNG BASES: Mild dependent atelectasis. Heart size is normal. No pericardial effusions. SOLID ORGANS: The liver, spleen, gallbladder, pancreas and adrenal glands are unremarkable. Slightly swirled appearance of the mesentery. GASTROINTESTINAL TRACT: Multiple loops of stool distended distal small bowel measuring up to 3 cm without wall thickening. Associated mesenteric fat stranding. Transition point at the level of the terminal ileum. Moderate amount of ascending colonic stool. Nonvisualized appendix. KIDNEYS/ URINARY TRACT: Kidneys are orthotopic, demonstrating symmetric enhancement. No nephrolithiasis, hydronephrosis or solid renal masses. 10 mm LEFT upper pole cyst. The unopacified ureters are normal in course and caliber. Delayed imaging through the kidneys demonstrates symmetric prompt contrast excretion within the proximal urinary collecting system. Urinary bladder is partially distended and unremarkable. PERITONEUM/RETROPERITONEUM: Aortoiliac vessels are normal in course and caliber. No lymphadenopathy by CT size criteria. Prostate size appears mildly enlarged. Small amount of low-density free fluid in the pelvis is likely physiologic. SOFT TISSUE/OSSEOUS STRUCTURES: Non-suspicious. Scarring in RIGHT inguinal canal compatible with prior herniorrhaphy. IMPRESSION: Distal small bowel feces and terminal ileal transition point. Mild inflammation of the terminal ileum may be inflammatory. Findings may also represent low-grade obstruction from adhesions, less likely internal hernia. No perforation. Nonvisualized appendix. Status post apparent RIGHT inguinal herniorrhaphy without mesh. Electronically Signed   By: Awilda Metroourtnay  Bloomer M.D.   On: 08/24/2015 06:39   I have personally reviewed and evaluated these images and lab results as part of my medical decision-making.   EKG Interpretation None      MDM    Final diagnoses:  Terminal ileitis, unspecified complication (HCC)    Patient presents with right lower quadrant pain. Is tender on exam. Considerations include appendicitis, gallbladder pathology, less likely SBO. Patient was given pain and nausea medication. White count of 13. CT scan obtained and shows a transition point in the terminal ileum with mild inflammation with possible low-grade obstruction from adhesions. Appendix was not visualized. Discussed with surgery. They will evaluate the patient. Anticipate admission.  I personally performed the services described in this documentation, which was scribed in my presence. The recorded information has been reviewed and is accurate.   Shon Batonourtney F Ashlie Mcmenamy, MD 08/24/15 0730

## 2015-08-24 NOTE — ED Notes (Signed)
EMESIS X 1 DURING TRIAGE

## 2015-08-24 NOTE — Progress Notes (Signed)
Report received from ED nurse, pt to remain npo for surgery this pm. Plans for NGT discontinued. Pt alert, awake and oriented. No c/o pain voiced

## 2015-08-24 NOTE — Anesthesia Preprocedure Evaluation (Signed)
Anesthesia Evaluation  Patient identified by MRN, date of birth, ID band Patient awake    Reviewed: Allergy & Precautions, NPO status , Patient's Chart, lab work & pertinent test results  Airway Mallampati: I  TM Distance: >3 FB Neck ROM: Full    Dental  (+) Teeth Intact, Caps,    Pulmonary neg pulmonary ROS,    Pulmonary exam normal breath sounds clear to auscultation       Cardiovascular Normal cardiovascular exam Rhythm:Regular Rate:Normal     Neuro/Psych negative neurological ROS  negative psych ROS   GI/Hepatic Neg liver ROS, Bowel obstruction   Endo/Other  negative endocrine ROS  Renal/GU Renal InsufficiencyRenal diseasenegative Renal ROS  negative genitourinary   Musculoskeletal negative musculoskeletal ROS (+)   Abdominal   Peds  Hematology negative hematology ROS (+)   Anesthesia Other Findings   Reproductive/Obstetrics                             Anesthesia Physical Anesthesia Plan  ASA: I  Anesthesia Plan: General   Post-op Pain Management:    Induction: Intravenous, Rapid sequence and Cricoid pressure planned  Airway Management Planned: Oral ETT  Additional Equipment:   Intra-op Plan:   Post-operative Plan: Extubation in OR  Informed Consent: I have reviewed the patients History and Physical, chart, labs and discussed the procedure including the risks, benefits and alternatives for the proposed anesthesia with the patient or authorized representative who has indicated his/her understanding and acceptance.   Dental advisory given  Plan Discussed with: CRNA, Anesthesiologist and Surgeon  Anesthesia Plan Comments:         Anesthesia Quick Evaluation

## 2015-08-24 NOTE — Anesthesia Procedure Notes (Signed)
Procedure Name: Intubation Date/Time: 08/24/2015 4:23 PM Performed by: Adonis HousekeeperNGELL, Sawyer Kahan M Pre-anesthesia Checklist: Patient identified, Emergency Drugs available, Suction available and Patient being monitored Patient Re-evaluated:Patient Re-evaluated prior to inductionOxygen Delivery Method: Circle system utilized Preoxygenation: Pre-oxygenation with 100% oxygen Intubation Type: IV induction, Cricoid Pressure applied and Rapid sequence Laryngoscope Size: Mac and 3 Grade View: Grade III Tube type: Oral Tube size: 7.5 mm Number of attempts: 1 Airway Equipment and Method: Stylet Placement Confirmation: ETT inserted through vocal cords under direct vision,  positive ETCO2 and breath sounds checked- equal and bilateral Secured at: 22 cm Tube secured with: Tape Dental Injury: Teeth and Oropharynx as per pre-operative assessment  Comments: Pt with protruding front teeth that are veneers

## 2015-08-24 NOTE — ED Notes (Signed)
PAIN MEDICATION ADMINISTERED IN TRIAGE AND PT INSTRUCTED NOT TO DRIVE, DRINK OR OPERATE HEAVY MACHINERY WITHIN 4-6 HOURS. PT HAS RIDE HOME BY WIFE.

## 2015-08-24 NOTE — Progress Notes (Signed)
Pt returned from pacu this evening. No c/o pain. Alert, oriented and making jokes. Family at bedside

## 2015-08-24 NOTE — Anesthesia Postprocedure Evaluation (Signed)
Anesthesia Post Note  Patient: Tollie Pizzaimothy J Iodice  Procedure(s) Performed: Procedure(s) (LRB): LAPAROSCOPIC ABDOMINAL EXPLORATION lysis of adhesions appendectomy (N/A)  Patient location during evaluation: PACU Anesthesia Type: General Level of consciousness: awake and alert Pain management: pain level controlled Vital Signs Assessment: post-procedure vital signs reviewed and stable Respiratory status: spontaneous breathing, nonlabored ventilation and respiratory function stable Cardiovascular status: blood pressure returned to baseline and stable Postop Assessment: no signs of nausea or vomiting Anesthetic complications: no    Last Vitals:  Filed Vitals:   08/24/15 1830 08/24/15 1847  BP: 134/75 140/76  Pulse: 66 74  Temp: 36.7 C 36.6 C  Resp: 15 15    Last Pain:  Filed Vitals:   08/24/15 1848  PainSc: 0-No pain                 Briah Nary,W. EDMOND

## 2015-08-25 LAB — BASIC METABOLIC PANEL
ANION GAP: 11 (ref 5–15)
BUN: 10 mg/dL (ref 6–20)
CALCIUM: 8.7 mg/dL — AB (ref 8.9–10.3)
CHLORIDE: 105 mmol/L (ref 101–111)
CO2: 24 mmol/L (ref 22–32)
Creatinine, Ser: 1.24 mg/dL (ref 0.61–1.24)
GFR calc non Af Amer: >60 mL/min (ref >60)
Glucose, Bld: 137 mg/dL — ABNORMAL HIGH (ref 65–99)
Potassium: 4.1 mmol/L (ref 3.5–5.1)
SODIUM: 140 mmol/L (ref 135–145)

## 2015-08-25 LAB — CBC
HCT: 38.1 % — ABNORMAL LOW (ref 39.0–52.0)
HEMOGLOBIN: 12.5 g/dL — AB (ref 13.0–17.0)
MCH: 29.9 pg (ref 26.0–34.0)
MCHC: 32.8 g/dL (ref 30.0–36.0)
MCV: 91.1 fL (ref 78.0–100.0)
Platelets: 302 10*3/uL (ref 150–400)
RBC: 4.18 MIL/uL — AB (ref 4.22–5.81)
RDW: 13.4 % (ref 11.5–15.5)
WBC: 9.6 10*3/uL (ref 4.0–10.5)

## 2015-08-25 MED ORDER — HYDROCODONE-ACETAMINOPHEN 5-325 MG PO TABS: 1.0000 | ORAL_TABLET | Freq: Four times a day (QID) | ORAL | Status: AC | PRN

## 2015-08-25 MED ORDER — ACETAMINOPHEN 325 MG PO TABS: 650.0000 mg | ORAL_TABLET | Freq: Four times a day (QID) | ORAL | Status: AC | PRN

## 2015-08-25 NOTE — Discharge Summary (Signed)
[facility] Discharge Summary   Patient ID: Lance Christensen MRN: [id number] DOB/AGE: [birth year] 50 y.o.  Admit date: 08/24/2015 Discharge date: 08/25/2015  Admitting Diagnosis: SBO  Discharge Diagnosis Patient Active Problem List   Diagnosis Date Noted  . SBO (small bowel obstruction) (HCC) due to adhesive band at terminal ileum 08/24/2015    Consultants None  Imaging: Ct Abdomen Pelvis W Contrast  08/24/2015  CLINICAL DATA:  RIGHT lower quadrant pain and vomiting beginning at 10 p.m. last night. History of hernia repairs. EXAM: CT ABDOMEN AND PELVIS WITH CONTRAST TECHNIQUE: Multidetector CT imaging of the abdomen and pelvis was performed using the standard protocol following bolus administration of intravenous contrast. CONTRAST:  100mL ISOVUE-300 IOPAMIDOL (ISOVUE-300) INJECTION 61% COMPARISON:  CT abdomen and pelvis December 11, 2003 FINDINGS: LUNG BASES: Mild dependent atelectasis. Heart size is normal. No pericardial effusions. SOLID ORGANS: The liver, spleen, gallbladder, pancreas and adrenal glands are unremarkable. Slightly swirled appearance of the mesentery. GASTROINTESTINAL TRACT: Multiple loops of stool distended distal small bowel measuring up to 3 cm without wall thickening. Associated mesenteric fat stranding. Transition point at the level of the terminal ileum. Moderate amount of ascending colonic stool. Nonvisualized appendix. KIDNEYS/ URINARY TRACT: Kidneys are orthotopic, demonstrating symmetric enhancement. No nephrolithiasis, hydronephrosis or solid renal masses. 10 mm LEFT upper pole cyst. The unopacified ureters are normal in course and caliber. Delayed imaging through the kidneys demonstrates symmetric prompt contrast excretion within the proximal urinary collecting system. Urinary bladder is partially distended and unremarkable. PERITONEUM/RETROPERITONEUM: Aortoiliac vessels are normal in course and caliber. No lymphadenopathy by CT size criteria. Prostate  size appears mildly enlarged. Small amount of low-density free fluid in the pelvis is likely physiologic. SOFT TISSUE/OSSEOUS STRUCTURES: Non-suspicious. Scarring in RIGHT inguinal canal compatible with prior herniorrhaphy. IMPRESSION: Distal small bowel feces and terminal ileal transition point. Mild inflammation of the terminal ileum may be inflammatory. Findings may also represent low-grade obstruction from adhesions, less likely internal hernia. No perforation. Nonvisualized appendix. Status post apparent RIGHT inguinal herniorrhaphy without mesh. Electronically Signed   By: Awilda Metroourtnay  Bloomer M.D.   On: 08/24/2015 06:39    Procedures Dr. Sheliah HatchKinsinger (08/24/15) - laparoscopic lysis of adhesions, laparoscopic appendectomy  Hospital Course:  50 y/o previously healthy white male with only PMH PVC's, plantar fasciitis [facility] with acute onset of RLQ abdominal pain since 7pm last night. He had a normal dinner last night of spaghetti and wine. By 10-10:30pm the pain became severe. He tried to go to sleep and drinking water, but that didn't help. No precipitating factors. About midnight the nausea and dry heaving started. He didn't throw up until he arrived in the ER which consisted of non-bilious/ non-bloody food contents. He feels distended, but no fever/chills, diarrhea, CP/SOB or dysuria. He notes his last BM was last night, no melena or hematochezia.  He notes 10 years ago he had a couple of more minor episodes of abdominal pain similar to this, but not anywhere as severe. He saw his PCP and was prescribed GERD meds, but he never took them and his pain eventually subsided completely. He never had any imaging workup. He notes he [facility] which was normal except for polyps and return in 5 years for repeat CSP. No personal hx or family hx of crohn's, UC, or colon cancers. No concern for food poisoning, no sick contacts, no recent travel. H/o b/l inguinal hernia  repairs with mesh in 1990 and 2000. In the ER  the EDP thought he had appendicitis, but the appendix was not visualized on the CT. He had a transition point at the terminal ileum and dilated small bowel with fecalization. WBC was 13.0, Cr. 1.32.   Patient was admitted and underwent procedure listed above.  Tolerated procedure well and was transferred to the floor.  He was found to have a single adhesive band at his terminal ileum which was the cause of his obstruction.  The appendix was normal appearing, but it was also removed due to his clinical presentation.  We await surgical pathology.  Diet was advanced as tolerated.  On POD #1, the patient was voiding well, tolerating diet, ambulating well, minimal pain, vital signs stable, incisions c/d/i and felt stable for discharge home.  Patient will follow up in our office in 3 weeks and knows to call with questions or concerns.    Physical Exam: General:  Alert, NAD, pleasant, comfortable Abd:  Soft, ND, mild tenderness, incisions C/D/I with dermabond in place    Medication List    TAKE these medications        acetaminophen 325 MG tablet  Commonly known as:  TYLENOL  Take 2 tablets (650 mg total) by mouth every 6 (six) hours as needed for mild pain (or temp > 100).     diclofenac 75 MG EC tablet  Commonly known as:  VOLTAREN  Take 1 tablet (75 mg total) by mouth 2 (two) times daily.     HYDROcodone-acetaminophen 5-325 MG tablet  Commonly known as:  NORCO/VICODIN  Take 1 tablet by mouth every 6 (six) hours as needed for moderate pain.         Follow-up Information    Follow up with [facility]. Go on 09/19/2015.   Specialty:  General Surgery   Why:  For post-operation check.  Your appointment is at 8:30am, please arrive at least before your appointment to complete your check in paperwork.  If you are unable to arrive prior to your appointment time we may have to cancel or reschedule you.   Contact  information:   [address] [place] [address] [phone]      Signed: Bradd Canary [facility] [phone]  08/25/2015, 10:49 AM

## 2015-08-25 NOTE — Discharge Instructions (Signed)
Your appointment is at 8:30am, please arrive at least 30min before your appointment to complete your check in paperwork.  If you are unable to arrive 30min prior to your appointment time we may have to cancel or reschedule you.  LAPAROSCOPIC SURGERY: POST OP INSTRUCTIONS  1. DIET: Follow a light bland diet the first 24 hours after arrival home, such as soup, liquids, crackers, etc. Be sure to include lots of fluids daily. Avoid fast food or heavy meals as your are more likely to get nauseated. Eat a low fat the next few days after surgery.  2. Take your usually prescribed home medications unless otherwise directed. 3. PAIN CONTROL:  1. Pain is best controlled by a usual combination of three different methods TOGETHER:  1. Ice/Heat 2. Over the counter pain medication 3. Prescription pain medication 2. Most patients will experience some swelling and bruising around the incisions. Ice packs or heating pads (30-60 minutes up to 6 times a day) will help. Use ice for the first few days to help decrease swelling and bruising, then switch to heat to help relax tight/sore spots and speed recovery. Some people prefer to use ice alone, heat alone, alternating between ice & heat. Experiment to what works for you. Swelling and bruising can take several weeks to resolve.  3. It is helpful to take an over-the-counter pain medication regularly for the first few weeks. Choose one of the following that works best for you:  1. Naproxen (Aleve, etc) Two 220mg  tabs twice a day 2. Ibuprofen (Advil, etc) Three 200mg  tabs four times a day (every meal & bedtime) 3. Acetaminophen (Tylenol, etc) 500-650mg  four times a day (every meal & bedtime) 4. A prescription for pain medication (such as oxycodone, hydrocodone, etc) should be given to you upon discharge. Take your pain medication as prescribed.  1. If you are having problems/concerns with the prescription medicine (does not control pain, nausea, vomiting, rash, itching,  etc), please call us [phone] to see if we need to switch you to a different pain medicine that will work better for you and/or control your side effect better. 2. If you need a refill on your pain medication, please contact your pharmacy. They will contact our office to request authorization. Prescriptions will not be filled after 5 pm or on week-ends. 4. Avoid getting constipated. Between the surgery and the pain medications, it is common to experience some constipation. Increasing fluid intake and taking a fiber supplement (such as Metamucil, Citrucel, FiberCon, MiraLax, etc) 1-2 times a day regularly will usually help prevent this problem from occurring. A mild laxative (prune juice, Milk of Magnesia, MiraLax, etc) should be taken according to package directions if there are no bowel movements after 48 hours.  5. Watch out for diarrhea. If you have many loose bowel movements, simplify your diet to bland foods & liquids for a few days. Stop any stool softeners and decrease your fiber supplement. Switching to mild anti-diarrheal medications (Kayopectate, Pepto Bismol) can help. If this worsens or does not improve, please call us. 6. Wash / shower every day. You may shower over the dressings as they are waterproof. Continue to shower over incision(s) after the dressing is off. 7. Remove your waterproof bandages 5 days after surgery. You may leave the incision open to air. You may replace a dressing/Band-Aid to cover the incision for comfort if you wish.  8. ACTIVITIES as tolerated:  1. You may resume regular (light) daily activities beginning the next day--such as daily self-care,  walking, climbing stairs--gradually increasing activities as tolerated. If you can walk 30 minutes without difficulty, it is safe to try more intense activity such as jogging, treadmill, bicycling, low-impact aerobics, swimming, etc. 2. Save the most intensive and strenuous activity for last such as sit-ups, heavy lifting,  contact sports, etc Refrain from any heavy lifting or straining until you are off narcotics for pain control.  3. DO NOT PUSH THROUGH PAIN. Let pain be your guide: If it hurts to do something, don't do it. Pain is your body warning you to avoid that activity for another week until the pain goes down. 4. You may drive when you are no longer taking prescription pain medication, you can comfortably wear a seatbelt, and you can safely maneuver your car and apply brakes. 5. You may have sexual intercourse when it is comfortable.  9. FOLLOW UP in our office  1. Please call [facility] at [phone] to set up an appointment to see your surgeon in the office for a follow-up appointment approximately 2-3 weeks after your surgery. 2. Make sure that you call for this appointment the day you arrive home to insure a convenient appointment time.      10. IF YOU HAVE DISABILITY OR FAMILY LEAVE FORMS, BRING THEM TO THE               OFFICE FOR PROCESSING.   WHEN TO CALL us [phone]:  1. Poor pain control 2. Reactions / problems with new medications (rash/itching, nausea, etc)  3. Fever over 101.5 F (38.5 C) 4. Inability to urinate 5. Nausea and/or vomiting 6. Worsening swelling or bruising 7. Continued bleeding from incision. 8. Increased pain, redness, or drainage from the incision  The clinic staff is available to answer your questions during regular business hours (8:30am-5pm). Please dont hesitate to call and ask to speak to one of our nurses for clinical concerns.  If you have a medical emergency, go to the nearest emergency room or call 911.  A surgeon from [facility] is always on call at the [facility], [address], [place], [place], [place] [address] ?  MAIN: [phone] ? TOLL FREE: [phone] ?  FAX [phone]  [email or web address]

## 2015-08-27 ENCOUNTER — Encounter (HOSPITAL_COMMUNITY): Payer: Self-pay | Admitting: General Surgery

## 2018-04-10 IMAGING — CT CT ABD-PELV W/ CM
2 of 5 series · 10 of 46 positions shown, 11 images · IV contrast (Iodine)
Comparison: CT abdomen and pelvis December 11, 2003

CLINICAL DATA: RIGHT lower quadrant pain and vomiting beginning at
10 p.m. last night. History of hernia repairs.

EXAM:
CT ABDOMEN AND PELVIS WITH CONTRAST
TECHNIQUE: Multidetector CT imaging of the abdomen and pelvis was performed
using the standard protocol following bolus administration of
intravenous contrast.
CONTRAST:  100mL UVF87U-CDD IOPAMIDOL (UVF87U-CDD) INJECTION 61%

[Series 201: routine, idose (2) · axial · 0.78mm/px · z∈[-852,-472]mm · 7 of 98 slices shown, 8 images]
[im 11/98  soft-tissue]
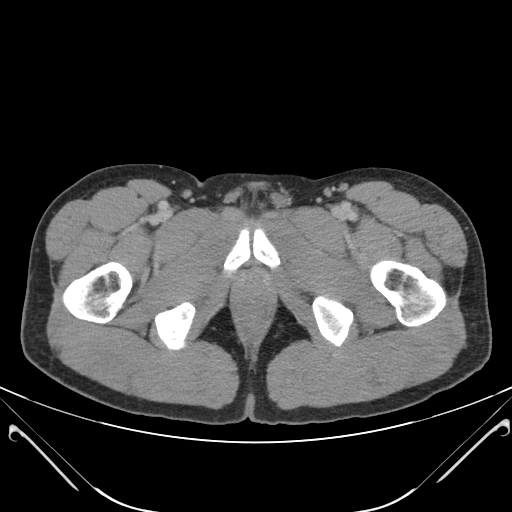
[im 11/98  bone]
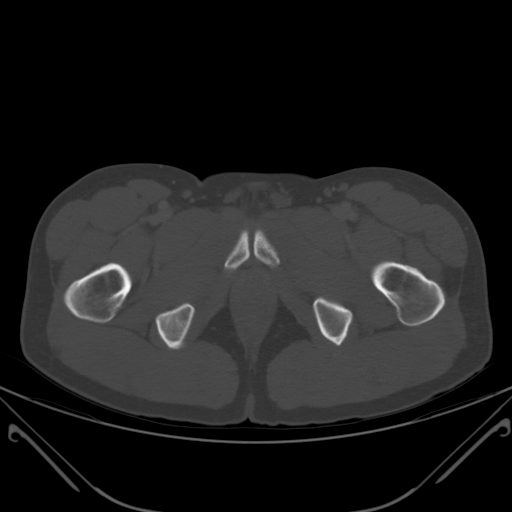
[im 21/98  soft-tissue]
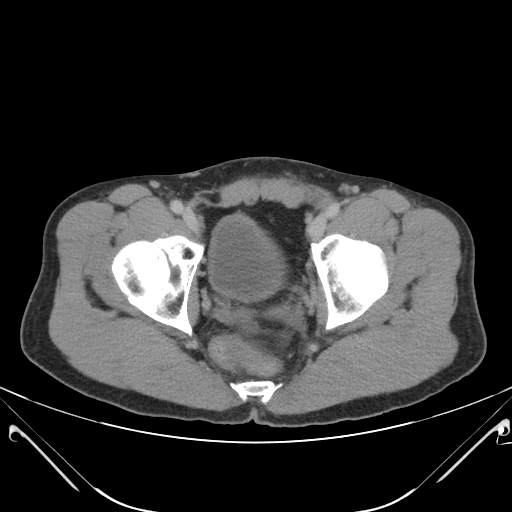
[im 36/98  soft-tissue]
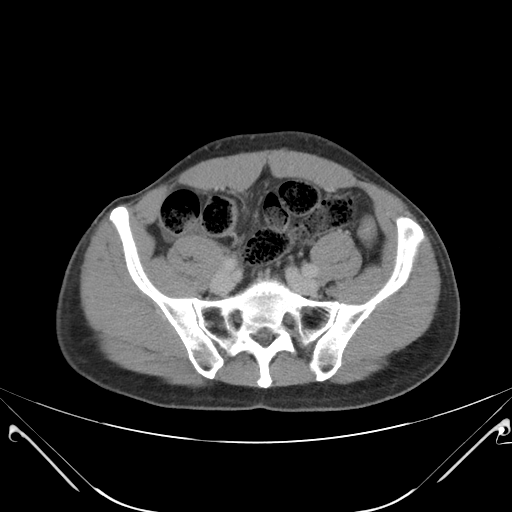
[im 52/98  soft-tissue]
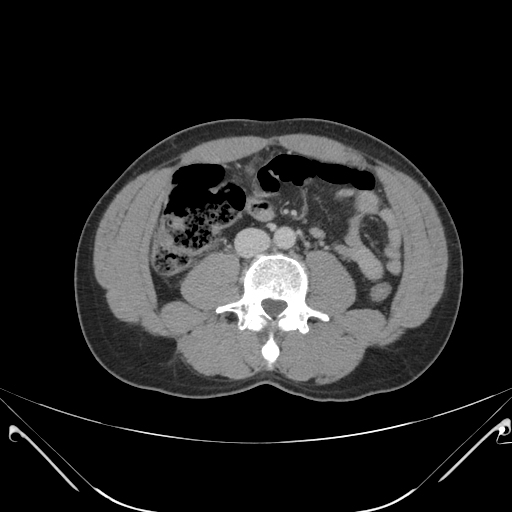
[im 62/98  soft-tissue]
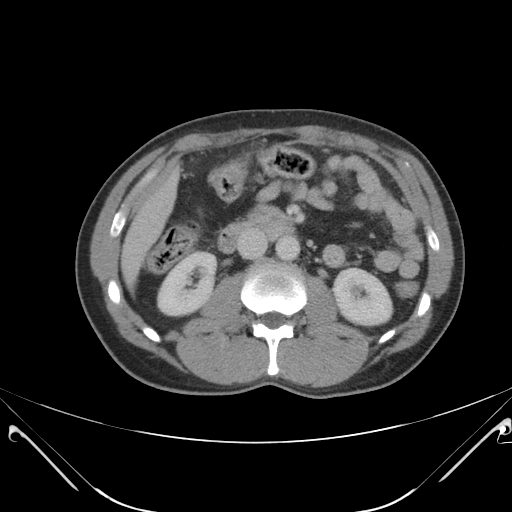
[im 77/98  soft-tissue]
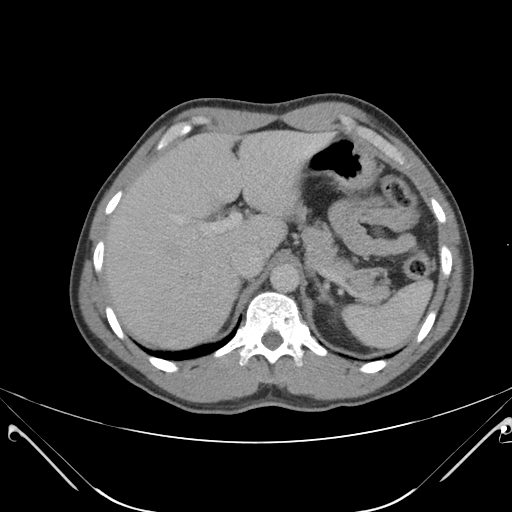
[im 87/98  soft-tissue]
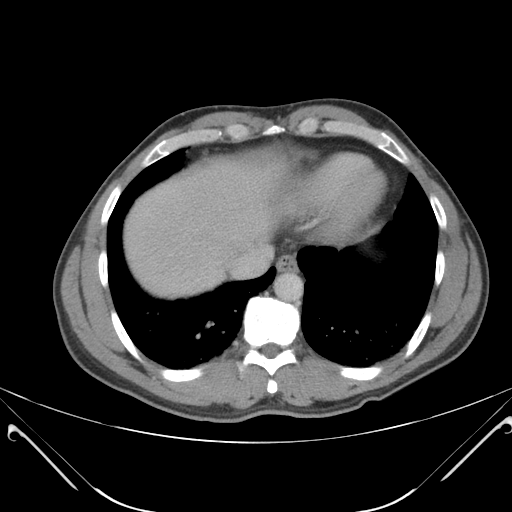

[Series 203: coronals, idose (2) · coronal · 0.45mm/px · 3 of 114 slices shown]
[im 38/114  soft-tissue]
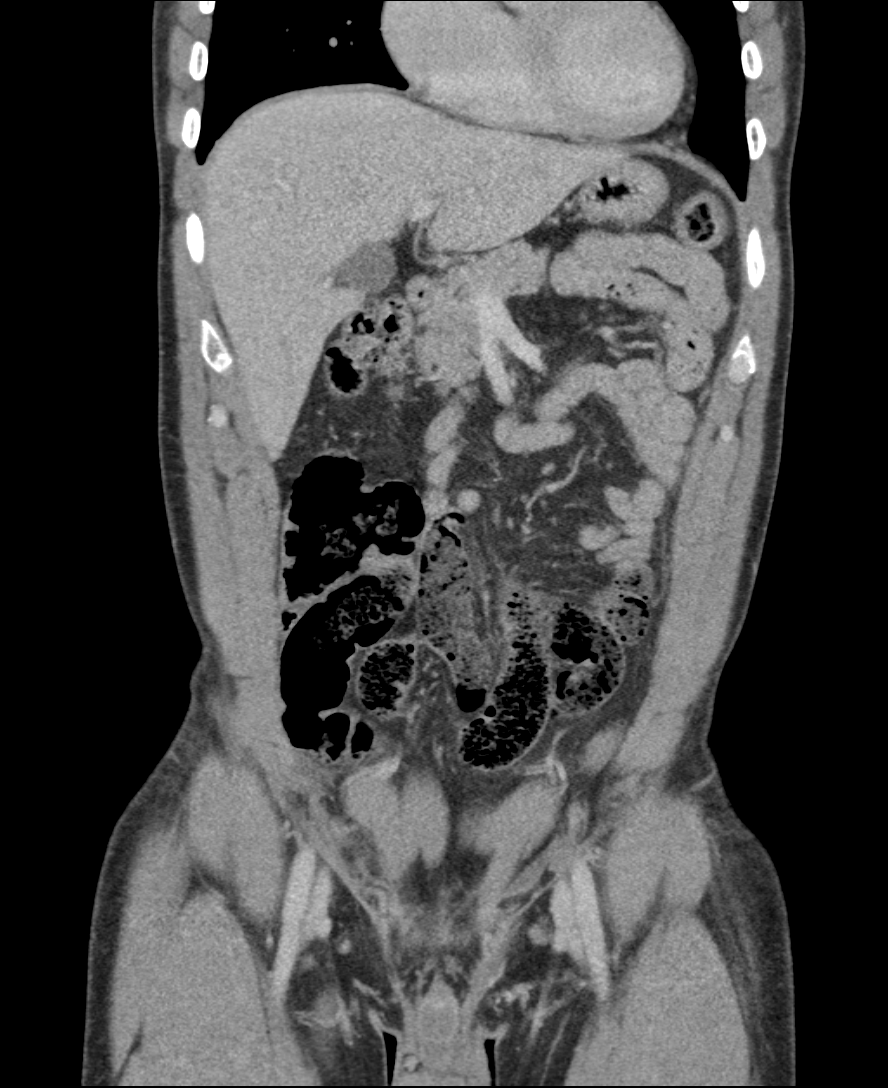
[im 51/114  soft-tissue]
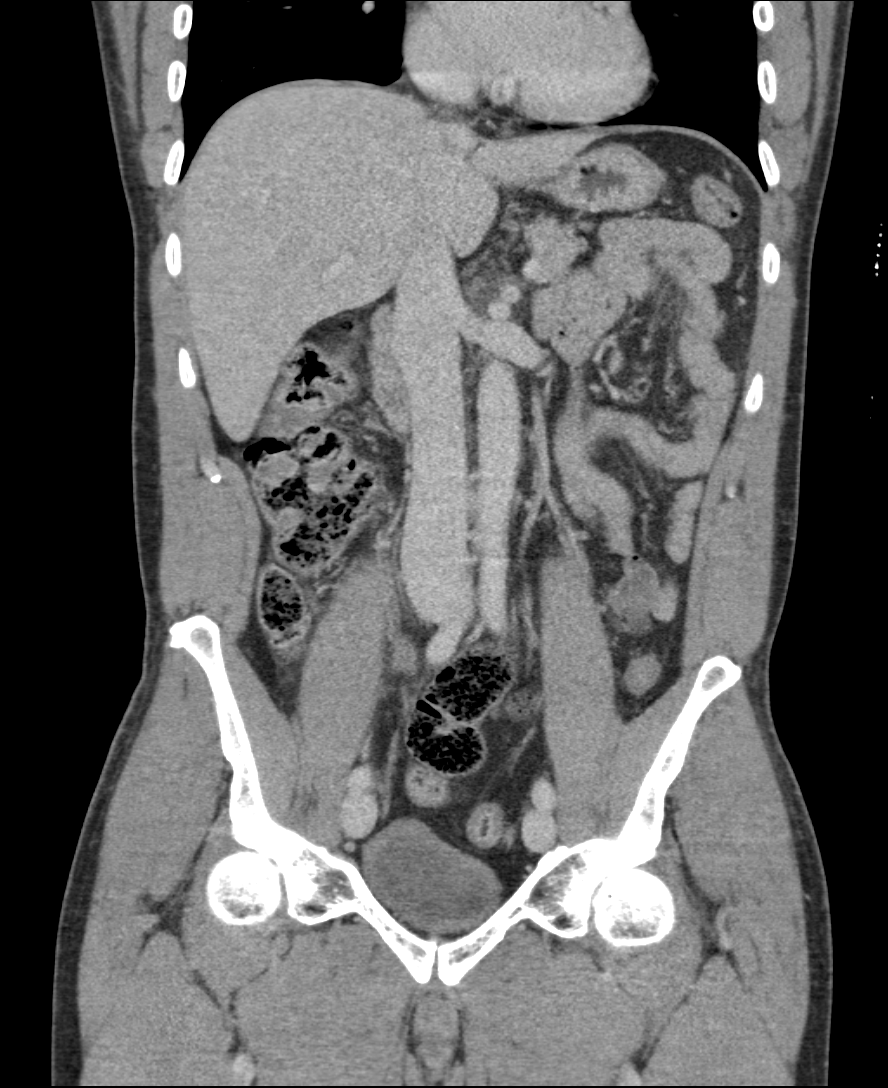
[im 63/114  soft-tissue]
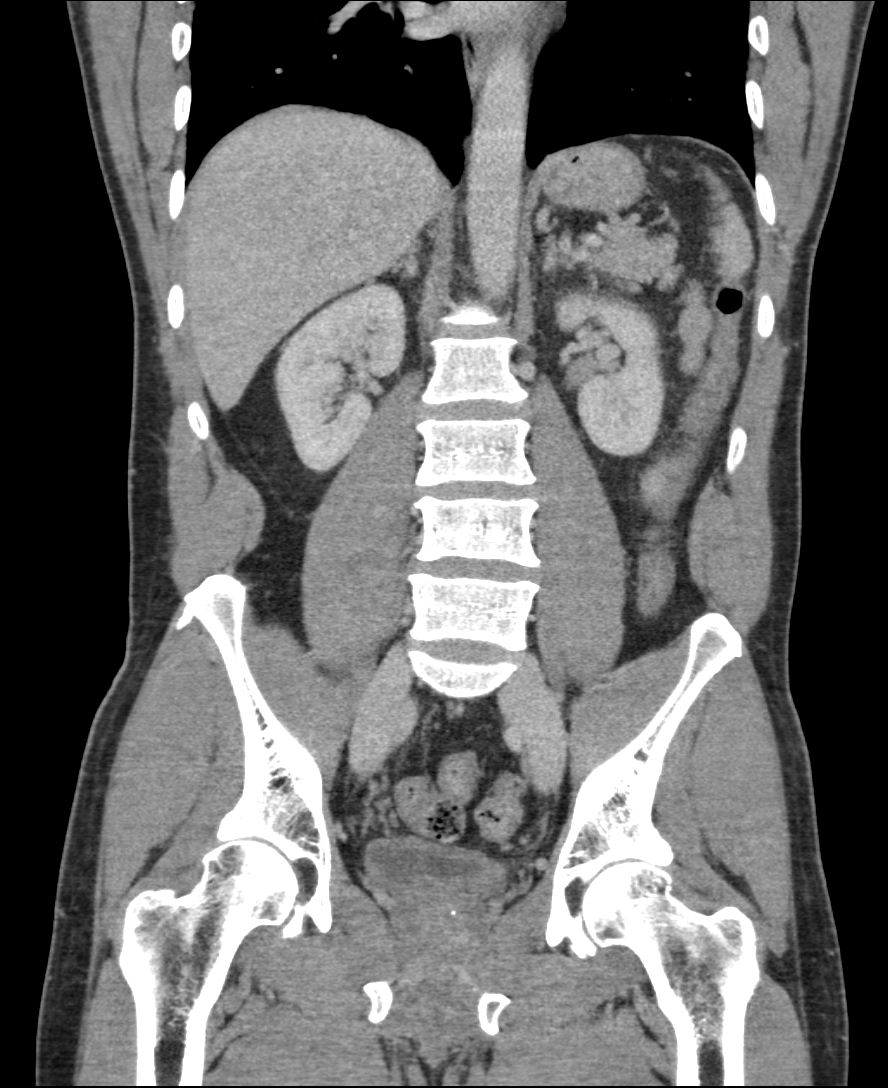

[10 of 46 positions shown; findings below may reference images not displayed]

FINDINGS: LUNG BASES: Mild dependent atelectasis. Heart size is normal. No
pericardial effusions.

SOLID ORGANS: The liver, spleen, gallbladder, pancreas and adrenal
glands are unremarkable. Slightly swirled appearance of the
mesentery.

GASTROINTESTINAL TRACT: Multiple loops of stool distended distal
small bowel measuring up to 3 cm without wall thickening. Associated
mesenteric fat stranding. Transition point at the level of the
terminal ileum. Moderate amount of ascending colonic stool.
Nonvisualized appendix.

KIDNEYS/ URINARY TRACT: Kidneys are orthotopic, demonstrating
symmetric enhancement. No nephrolithiasis, hydronephrosis or solid
renal masses. 10 mm LEFT upper pole cyst. The unopacified ureters
are normal in course and caliber. Delayed imaging through the
kidneys demonstrates symmetric prompt contrast excretion within the
proximal urinary collecting system. Urinary bladder is partially
distended and unremarkable.

PERITONEUM/RETROPERITONEUM: Aortoiliac vessels are normal in course
and caliber. No lymphadenopathy by CT size criteria. Prostate size
appears mildly enlarged. Small amount of low-density free fluid in
the pelvis is likely physiologic.

SOFT TISSUE/OSSEOUS STRUCTURES: Non-suspicious. Scarring in RIGHT
inguinal canal compatible with prior herniorrhaphy.
IMPRESSION: Distal small bowel feces and terminal ileal transition point. Mild
inflammation of the terminal ileum may be inflammatory. Findings may
also represent low-grade obstruction from adhesions, less likely
internal hernia. No perforation. Nonvisualized appendix.

Status post apparent RIGHT inguinal herniorrhaphy without mesh.
